# Patient Record
Sex: Male | Born: 2000 | Race: White | Hispanic: No | Marital: Single | Smoking: Never smoker
Health system: Southern US, Academic
[De-identification: ages and names within clinical notes are randomized; demographics above are authoritative.]

## PROBLEM LIST (undated history)

## (undated) DIAGNOSIS — R7303 Prediabetes: Secondary | ICD-10-CM

## (undated) DIAGNOSIS — I10 Essential (primary) hypertension: Secondary | ICD-10-CM

## (undated) HISTORY — DX: Prediabetes: R73.03

## (undated) HISTORY — PX: HX WISDOM TEETH EXTRACTION: SHX21

## (undated) HISTORY — DX: Essential (primary) hypertension: I10

## (undated) NOTE — Progress Notes (Signed)
 Formatting of this note might be different from the original.  Subjective   Patient ID: Jason Kelley is a 70 y.o. male presenting to the Urgent Care with a chief complaint of Penis Pain (Penis swelling with discomfort x today).    Red, swollen ring around penis, does not burn, itch, or have any lesions. States he is not concerned for STI he has had the same partner for 7-8 months.     History provided by:  Patient  Language interpreter used: No    Penis Pain  Onset quality:  Sudden  Duration:  1 day  Progression:  Unchanged  Chronicity:  New  Context:  Red, swollen ring around base of penis. there are no lesions, vesicles, or any other skin anomalies  Associated symptoms: no fever      Objective   BP 123/74   Pulse 98   Temp 36.3 C (97.4 F) (Temporal)   Resp 18   Ht 1.905 m (6' 3)   Wt (!) 143 kg (315 lb)   SpO2 98%   BMI 39.37 kg/m     Physical Exam  Vitals reviewed.   Constitutional:       Appearance: Normal appearance.   Cardiovascular:      Rate and Rhythm: Normal rate.   Pulmonary:      Effort: Pulmonary effort is normal.   Genitourinary:     Comments: Erythemic, shiny, red ring around base of penis, it is only a little swollen, he states it has improved since this morning. He has had no lesions. He states the swelling has improved. He is having no urinary symptoms.  Skin:     General: Skin is warm and dry.   Neurological:      General: No focal deficit present.      Mental Status: He is alert and oriented to person, place, and time.   Psychiatric:         Mood and Affect: Mood normal.         Behavior: Behavior normal.         Assessment & Plan    Assessment & Plan  Candidiasis            In-House Lab Results:   No results found for this or any previous visit.     In-House Imaging Reads:        Procedure Documentation:  Procedures     ED Course & MDM   MDM - Medical Decision Making: Patient declined HSV testing.   Electronically signed by Dufm Stephane Lunger, CRNP at 12/12/2023  2:20 PM EDT

---
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
## 2022-10-30 ENCOUNTER — Other Ambulatory Visit: Payer: Self-pay

## 2022-10-30 ENCOUNTER — Emergency Department
Admission: EM | Admit: 2022-10-30 | Discharge: 2022-10-30 | Disposition: A | Discharge status: Home / Self Care | Payer: Managed Care, Other (non HMO) | Attending: NURSE PRACTITIONER | Admitting: NURSE PRACTITIONER

## 2022-10-30 ENCOUNTER — Emergency Department (HOSPITAL_COMMUNITY): Payer: Managed Care, Other (non HMO)

## 2022-10-30 DIAGNOSIS — M5117 Intervertebral disc disorders with radiculopathy, lumbosacral region: Secondary | ICD-10-CM | POA: Insufficient documentation

## 2022-10-30 DIAGNOSIS — M5126 Other intervertebral disc displacement, lumbar region: Secondary | ICD-10-CM | POA: Insufficient documentation

## 2022-10-30 DIAGNOSIS — M5442 Lumbago with sciatica, left side: Secondary | ICD-10-CM

## 2022-10-30 DIAGNOSIS — M5136 Other intervertebral disc degeneration, lumbar region: Secondary | ICD-10-CM | POA: Insufficient documentation

## 2022-10-30 MED ORDER — METHOCARBAMOL 100 MG/ML INJECTION SOLUTION
1.0000 g | INTRAMUSCULAR | Status: DC
Start: 2022-10-30 — End: 2022-10-30

## 2022-10-30 MED ORDER — METHOCARBAMOL 100 MG/ML INJECTION SOLUTION
INTRAMUSCULAR | Status: AC
Start: 2022-10-30 — End: 2022-10-30
  Filled 2022-10-30: qty 10

## 2022-10-30 MED ORDER — MORPHINE 4 MG/ML INJECTION WRAPPER
INJECTION | INTRAMUSCULAR | Status: AC
Start: 2022-10-30 — End: 2022-10-30
  Filled 2022-10-30: qty 1

## 2022-10-30 MED ORDER — METHYLPREDNISOLONE SOD SUCC 125 MG SOLUTION FOR INJECTION WRAPPER
125.0000 mg | INTRAVENOUS | Status: AC
Start: 2022-10-30 — End: 2022-10-30
  Administered 2022-10-30: 125 mg via INTRAVENOUS

## 2022-10-30 MED ORDER — SODIUM CHLORIDE 0.9 % (FLUSH) INJECTION SYRINGE
3.0000 mL | INJECTION | Freq: Three times a day (TID) | INTRAMUSCULAR | Status: DC
Start: 2022-10-30 — End: 2022-10-30

## 2022-10-30 MED ORDER — METHOCARBAMOL 500 MG TABLET
500.0000 mg | ORAL_TABLET | Freq: Four times a day (QID) | ORAL | 0 refills | Status: AC
Start: 2022-10-30 — End: 2022-11-06

## 2022-10-30 MED ORDER — METHOCARBAMOL 100 MG/ML INJECTION SOLUTION
1.0000 g | INTRAMUSCULAR | Status: AC
Start: 2022-10-30 — End: 2022-10-30
  Administered 2022-10-30: 1000 mg via INTRAVENOUS

## 2022-10-30 MED ORDER — SODIUM CHLORIDE 0.9 % (FLUSH) INJECTION SYRINGE
3.0000 mL | INJECTION | INTRAMUSCULAR | Status: DC | PRN
Start: 2022-10-30 — End: 2022-10-30

## 2022-10-30 MED ORDER — MORPHINE 4 MG/ML INJECTION WRAPPER
4.0000 mg | INJECTION | INTRAMUSCULAR | Status: AC
Start: 2022-10-30 — End: 2022-10-30
  Administered 2022-10-30: 4 mg via INTRAVENOUS

## 2022-10-30 MED ORDER — PREDNISONE 20 MG TABLET
20.0000 mg | ORAL_TABLET | Freq: Two times a day (BID) | ORAL | 0 refills | Status: AC
Start: 2022-10-30 — End: 2022-11-04

## 2022-10-30 MED ORDER — NAPROXEN 500 MG TABLET
500.0000 mg | ORAL_TABLET | Freq: Two times a day (BID) | ORAL | 0 refills | Status: AC
Start: 2022-10-30 — End: ?

## 2022-10-30 MED ORDER — METHYLPREDNISOLONE SOD SUCC 125 MG SOLUTION FOR INJECTION WRAPPER
INTRAVENOUS | Status: AC
Start: 2022-10-30 — End: 2022-10-30
  Filled 2022-10-30: qty 2

## 2022-10-30 MED ORDER — TRAMADOL 50 MG TABLET
1.0000 | ORAL_TABLET | Freq: Three times a day (TID) | ORAL | 0 refills | Status: AC | PRN
Start: 2022-10-30 — End: 2022-11-02

## 2022-10-30 NOTE — ED Nurses Note (Signed)
Patient received to room 5 with complaints of pain, numbness, and tingling in back down left leg. Pt states this started 5 weeks ago and has had intermittent relief with muscle relaxer usage. Pt states he was seen last night in the ED for pain at another facility, was medicated with dilaudid and Norco. Patient placed on monitor, VSS, assessment and history completed. Call light in hand, encouraged to call for any needs. Awaiting providers orders at this time.

## 2022-10-30 NOTE — ED Triage Notes (Signed)
Patient c/o back pain and sciatica down the left leg with cramping in the left calf for 5 weeks. [place]. Numbness and tingling in the toes on left foot.
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
## 2022-10-30 NOTE — ED Nurses Note (Signed)
Patient discharged home with family. AVS reviewed with patient. A written copy of the AVS and discharge instructions were given to the patient. Questions sufficiently answered as needed. Patient encouraged to follow up with PCP as indicated. In the event of an emergency, patient instructed to call 911 or go to the nearest emergency room. Patient left department via ambulation. Pt significant other at bedside to drive pt home. Pt left with printed discharge instructions and printed prescriptions in hand.
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
## 2022-12-13 ENCOUNTER — Ambulatory Visit (INDEPENDENT_AMBULATORY_CARE_PROVIDER_SITE_OTHER): Payer: Self-pay | Admitting: Neurological Surgery

## 2022-12-13 NOTE — Telephone Encounter (Signed)
Patient had MRI done at [facility] in [place], [place], they are unable to push imaging to [place].  Confirmed with Ayinde that he has the imaging disc and will bring to his appointment on 12/24/22.    Shawnie Pons, Ambulatory Care Assistant'

## 2022-12-17 ENCOUNTER — Ambulatory Visit (INDEPENDENT_AMBULATORY_CARE_PROVIDER_SITE_OTHER): Payer: Self-pay | Admitting: Neurological Surgery

## 2022-12-24 ENCOUNTER — Ambulatory Visit: Payer: Managed Care, Other (non HMO) | Attending: Neurological Surgery | Admitting: Neurological Surgery

## 2022-12-24 ENCOUNTER — Encounter (INDEPENDENT_AMBULATORY_CARE_PROVIDER_SITE_OTHER): Payer: Self-pay | Admitting: Neurological Surgery

## 2022-12-24 ENCOUNTER — Other Ambulatory Visit: Payer: Self-pay

## 2022-12-24 VITALS — BP 147/78 | HR 81 | Temp 97.4°F | Ht 76.0 in | Wt 366.6 lb

## 2022-12-24 DIAGNOSIS — M545 Low back pain, unspecified: Secondary | ICD-10-CM | POA: Insufficient documentation

## 2022-12-24 DIAGNOSIS — M5416 Radiculopathy, lumbar region: Secondary | ICD-10-CM | POA: Insufficient documentation

## 2022-12-24 DIAGNOSIS — M5116 Intervertebral disc disorders with radiculopathy, lumbar region: Secondary | ICD-10-CM

## 2022-12-24 DIAGNOSIS — M5126 Other intervertebral disc displacement, lumbar region: Secondary | ICD-10-CM | POA: Insufficient documentation
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
## 2023-09-07 ENCOUNTER — Other Ambulatory Visit: Payer: Self-pay

## 2023-09-07 ENCOUNTER — Emergency Department
Admission: EM | Admit: 2023-09-07 | Discharge: 2023-09-08 | Disposition: A | Discharge status: Home / Self Care | Attending: Emergency Medicine | Admitting: Emergency Medicine

## 2023-09-07 DIAGNOSIS — R109 Unspecified abdominal pain: Secondary | ICD-10-CM

## 2023-09-07 DIAGNOSIS — R112 Nausea with vomiting, unspecified: Secondary | ICD-10-CM

## 2023-09-07 DIAGNOSIS — R1010 Upper abdominal pain, unspecified: Secondary | ICD-10-CM

## 2023-09-07 DIAGNOSIS — K760 Fatty (change of) liver, not elsewhere classified: Secondary | ICD-10-CM | POA: Insufficient documentation

## 2023-09-07 DIAGNOSIS — Z1152 Encounter for screening for COVID-19: Secondary | ICD-10-CM | POA: Insufficient documentation

## 2023-09-07 DIAGNOSIS — R1011 Right upper quadrant pain: Secondary | ICD-10-CM | POA: Insufficient documentation

## 2023-09-07 LAB — COVID-19, FLU A/B, RSV RAPID BY PCR
INFLUENZA VIRUS TYPE A: NOT DETECTED
INFLUENZA VIRUS TYPE B: NOT DETECTED
RESPIRATORY SYNCTIAL VIRUS (RSV): NOT DETECTED
SARS-CoV-2: NOT DETECTED

## 2023-09-07 NOTE — ED Triage Notes (Signed)
 Pt complains of abdominal pain lower right side, sharp,started suddenly on Monday. N/D denies nausea. Hurts with movement.

## 2023-09-08 ENCOUNTER — Emergency Department (HOSPITAL_COMMUNITY)

## 2023-09-08 ENCOUNTER — Encounter (HOSPITAL_COMMUNITY): Payer: Self-pay | Admitting: Emergency Medicine

## 2023-09-08 DIAGNOSIS — K6389 Other specified diseases of intestine: Secondary | ICD-10-CM

## 2023-09-08 DIAGNOSIS — K760 Fatty (change of) liver, not elsewhere classified: Secondary | ICD-10-CM

## 2023-09-08 LAB — CBC WITH DIFF
BASOPHIL #: 0 10*3/uL (ref 0.00–0.10)
BASOPHIL %: 1 % (ref 0–1)
EOSINOPHIL #: 0.2 10*3/uL (ref 0.00–0.50)
EOSINOPHIL %: 2 % (ref 1–8)
HCT: 43.2 % (ref 36.7–47.1)
HGB: 14.9 g/dL (ref 12.5–16.3)
LYMPHOCYTE #: 2.8 10*3/uL (ref 1.00–3.20)
LYMPHOCYTE %: 33 % (ref 15–43)
MCH: 28.7 pg (ref 23.8–33.4)
MCHC: 34.6 g/dL (ref 32.5–36.3)
MCV: 83 fL (ref 73.0–96.2)
MONOCYTE #: 1.1 10*3/uL (ref 0.30–1.10)
MONOCYTE %: 13 % (ref 6–14)
MPV: 8.6 fL (ref 7.4–11.4)
NEUTROPHIL #: 4.3 10*3/uL (ref 1.70–7.60)
NEUTROPHIL %: 51 % (ref 44–74)
PLATELETS: 300 10*3/uL (ref 140–440)
RBC: 5.2 10*6/uL (ref 4.06–5.63)
RDW: 14.2 % (ref 12.1–16.2)
WBC: 8.5 10*3/uL (ref 3.6–10.2)

## 2023-09-08 LAB — COMPREHENSIVE METABOLIC PANEL, NON-FASTING
ALBUMIN/GLOBULIN RATIO: 1.3 (ref 0.8–1.4)
ALBUMIN: 4.2 g/dL (ref 3.5–5.7)
ALKALINE PHOSPHATASE: 51 U/L (ref 34–104)
ALT (SGPT): 43 U/L (ref 7–52)
ANION GAP: 6 mmol/L (ref 4–13)
AST (SGOT): 24 U/L (ref 13–39)
BILIRUBIN TOTAL: 0.4 mg/dL (ref 0.3–1.0)
BUN/CREA RATIO: 25 — ABNORMAL HIGH (ref 6–22)
BUN: 20 mg/dL (ref 7–25)
CALCIUM, CORRECTED: 9.2 mg/dL (ref 8.9–10.8)
CALCIUM: 9.4 mg/dL (ref 8.6–10.3)
CHLORIDE: 106 mmol/L (ref 98–107)
CO2 TOTAL: 24 mmol/L (ref 21–31)
CREATININE: 0.8 mg/dL (ref 0.60–1.30)
ESTIMATED GFR: 128 mL/min/{1.73_m2} (ref >59)
GLOBULIN: 3.3 (ref 2.0–3.5)
GLUCOSE: 96 mg/dL (ref 74–109)
OSMOLALITY, CALCULATED: 275 mosm/kg (ref 270–290)
POTASSIUM: 4 mmol/L (ref 3.5–5.1)
PROTEIN TOTAL: 7.5 g/dL (ref 6.4–8.9)
SODIUM: 136 mmol/L (ref 136–145)

## 2023-09-08 LAB — URINALYSIS, MACROSCOPIC
BILIRUBIN: NEGATIVE mg/dL
BLOOD: NEGATIVE mg/dL
GLUCOSE: NEGATIVE mg/dL
KETONES: NEGATIVE mg/dL
LEUKOCYTES: NEGATIVE WBCs/uL
NITRITE: NEGATIVE
PH: 6.5 (ref 5.0–9.0)
PROTEIN: NEGATIVE mg/dL
SPECIFIC GRAVITY: 1.033 — ABNORMAL HIGH (ref 1.002–1.030)
UROBILINOGEN: NORMAL mg/dL

## 2023-09-08 LAB — LACTIC ACID LEVEL W/ REFLEX FOR LEVEL >2.0: LACTIC ACID: 0.8 mmol/L (ref 0.5–2.2)

## 2023-09-08 LAB — URINALYSIS, MICROSCOPIC
RBCS: <1 /HPF (ref <4)
SQUAMOUS EPITHELIAL: 1 /HPF (ref <28)
WBCS: 1 /HPF (ref <6)

## 2023-09-08 LAB — BLUE TOP TUBE

## 2023-09-08 LAB — GOLD TOP TUBE

## 2023-09-08 LAB — C-REACTIVE PROTEIN (CRP): C-REACTIVE PROTEIN (CRP): 2 mg/dL — ABNORMAL HIGH (ref 0.1–0.5)

## 2023-09-08 MED ORDER — METRONIDAZOLE 500 MG TABLET
500.0000 mg | ORAL_TABLET | Freq: Three times a day (TID) | ORAL | 0 refills | Status: AC
Start: 2023-09-08 — End: 2023-09-15

## 2023-09-08 MED ORDER — IOHEXOL 350 MG IODINE/ML INTRAVENOUS SOLUTION
50.0000 mL | INTRAVENOUS | Status: AC
Start: 2023-09-08 — End: 2023-09-08
  Administered 2023-09-08: 75 mL via INTRAVENOUS

## 2023-09-08 MED ORDER — CIPROFLOXACIN 500 MG TABLET
500.0000 mg | ORAL_TABLET | Freq: Two times a day (BID) | ORAL | 0 refills | Status: AC
Start: 2023-09-08 — End: 2023-09-15

## 2023-09-08 NOTE — ED Provider Notes (Signed)
 [facility] - Emergency Department  ED Primary Provider Note  History of Present Illness   Chief Complaint   Patient presents with    Abdominal Pain     Arrival: The patient arrived by Car  Jason Kelley is a 23 y.o. male who had concerns including Abdominal Pain.  The patient complains of pain in the right and upper abdomen.  The patient states his pain began in the right side of his abdomen and has now progressed across the top of his abdomen.  He states symptoms began 2 days ago.  States he has some nausea with the as vomiting.  Denies fever.     History Reviewed This Encounter: Medical History  Surgical History  Family History  Social History    Physical Exam   ED Triage Vitals [09/07/23 2306]   BP    Heart Rate 94   Respiratory Rate 20   Temperature 36.6 C (97.9 F)   SpO2    Weight (!) 159 kg (350 lb)   Height 1.93 m (6\' 4" )       Constitutional:  23 y.o. male who appears in no distress. Normal color, no cyanosis.   HENT:   Head: Normocephalic and atraumatic.   Mouth/Throat: Oropharynx is clear and moist.   Eyes: EOMI, PERRL   Neck: Trachea midline. Neck supple.  Cardiovascular: RRR, No murmurs, rubs or gallops. Intact distal pulses.  Pulmonary/Chest: BS equal bilaterally. No respiratory distress. No wheezes, rales or chest tenderness.   Abdominal: Bowel sounds present and normal. Abdomen soft, upper abdominal tenderness, no rebound and no guarding.  Back: No midline spinal tenderness, no paraspinal tenderness, no CVA tenderness.           Musculoskeletal: No edema, tenderness or deformity.  Skin: warm and dry. No rash, erythema, pallor or cyanosis  Psychiatric: normal mood and affect. Behavior is normal.   Neurological: Patient keenly alert and responsive, easily able to raise eyebrows, facial muscles/expressions symmetric, speaking in fluent sentences, moving all extremities equally and fully, normal gait  Patient Data     Labs Ordered/Reviewed   COMPREHENSIVE METABOLIC PANEL,  NON-FASTING - Abnormal; Notable for the following components:       Result Value    BUN/CREA RATIO 25 (*)     All other components within normal limits    Narrative:     Estimated Glomerular Filtration Rate (eGFR) is calculated using the CKD-EPI (2021) equation, intended for patients 31 years of age and older. If gender is not documented or "unknown", there will be no eGFR calculation.     C-REACTIVE PROTEIN (CRP) - Abnormal; Notable for the following components:    C-REACTIVE PROTEIN (CRP) 2.0 (*)     All other components within normal limits   URINALYSIS, MACROSCOPIC - Abnormal; Notable for the following components:    SPECIFIC GRAVITY 1.033 (*)     All other components within normal limits   COVID-19, FLU A/B, RSV RAPID BY PCR - Normal    Narrative:     Results are for the simultaneous qualitative identification of SARS-CoV-2 (formerly 2019-nCoV), Influenza A, Influenza B, and RSV RNA. These etiologic agents are generally detectable in nasopharyngeal and nasal swabs during the ACUTE PHASE of infection. Hence, this test is intended to be performed on respiratory specimens collected from individuals with signs and symptoms of upper respiratory tract infection who meet Centers for Disease Control and Prevention (CDC) clinical and/or epidemiological criteria for Coronavirus Disease 2019 (COVID-19) testing. CDC COVID-19 criteria  for testing on human specimens is available at [facility] webpage information for Healthcare Professionals: Coronavirus Disease 2019 (COVID-19) ([email or web address]).     False-negative results may occur if the virus has genomic mutations, insertions, deletions, or rearrangements or if performed very early in the course of illness. Otherwise, negative results indicate virus specific RNA targets are not detected, however negative results do not preclude SARS-CoV-2 infection/COVID-19, Influenza, or Respiratory syncytial virus infection. Results should not be used  as the sole basis for patient management decisions. Negative results must be combined with clinical observations, patient history, and epidemiological information. If upper respiratory tract infection is still suspected based on exposure history together with other clinical findings, re-testing should be considered.    Test methodology:   Cepheid Xpert Xpress SARS-CoV-2/Flu/RSV Assay real-time polymerase chain reaction (RT-PCR) test on the GeneXpert Dx and Xpert Xpress systems.   LACTIC ACID LEVEL W/ REFLEX FOR LEVEL >2.0 - Normal   CBC WITH DIFF - Normal   URINALYSIS, MICROSCOPIC - Normal   CBC/DIFF    Narrative:     The following orders were created for panel order CBC/DIFF.  Procedure                               Abnormality         Status                     ---------                               -----------         ------                     CBC WITH WNUU[[id number]]                Normal              Final result                 Please view results for these tests on the individual orders.   URINALYSIS, MACROSCOPIC AND MICROSCOPIC W/CULTURE REFLEX    Narrative:     The following orders were created for panel order URINALYSIS, MACROSCOPIC AND MICROSCOPIC W/CULTURE REFLEX [PRN ONLY].  Procedure                               Abnormality         Status                     ---------                               -----------         ------                     URINALYSIS, MACROSCOPIC[[id number]]      Abnormal            Final result               URINALYSIS, MICROSCOPIC[[id number]]      Normal              Final result  Please view results for these tests on the individual orders.   EXTRA TUBES    Narrative:     The following orders were created for panel order EXTRA TUBES.  Procedure                               Abnormality         Status                     ---------                               -----------         ------                     BLUE TOP [phone]                                     In process                 GOLD TOP TUBE[[id number]]                                    In process                   Please view results for these tests on the individual orders.   BLUE TOP TUBE   GOLD TOP TUBE     CT ABDOMEN PELVIS W IV CONTRAST   Final Result by Edi, Radresults In (05/08 0220)      1. Subtle inflammation abutting the cecal base, although this is in close proximity to the appendix the fact the appendix is not enlarged/dilated would argue against appendicitis. Diagnostic considerations would include a subtle omental infarct in the right lower quadrant or potentially a right-sided diverticulitis from a tiny diverticulum that is poorly visualized by CT. Close clinical follow-up is recommended   2. Hepatic steatosis   3. Additional findings/details as above         Radiologist location ID: [facility]           Medical Decision Making        Medical Decision Making  The patient presents to the ED due to upper abdominal pain.  Labs were obtained and are unremarkable.  CT abdomen pelvis shows some mild stranding at the cecum however there were no other definite or convincing signs of appendicitis.  The patient will be discharged home with a prescription for Cipro and Flagyl.      ED Course as of 09/08/23 0235   Thu Sep 08, 2023   0233 WBC: 8.5  Normal   0233 C-REACTIVE PROTEIN (CRP)(!): 2.0  Only slightly elevated   0233 LACTIC ACID: 0.8  Normal   0233 CREATININE: 0.80  Normal   0233 URINALYSIS, MACROSCOPIC AND MICROSCOPIC W/CULTURE REFLEX [PRN ONLY](!)  Normal            Medications Ordered/Administered in the ED   iohexol (OMNIPAQUE 350) infusion (75 mL Intravenous Given 09/08/23 0142)     Clinical Impression   Abdominal pain, unspecified abdominal location (Primary)       Disposition: Discharged

## 2023-09-08 NOTE — Discharge Instructions (Addendum)
 Cipro 500 mg twice daily for the next 7 days   Flagyl 500 mg every 8 hours for the next 7 days.      Return emergency department if worse and as needed.

## 2023-09-08 NOTE — ED Notes (Signed)
 [facility] - Emergency Department  Peer Recovery Coach Assessment    Initial Evaluation  Referred by:: Nurse  Location of Evaluation: Emergency Department  How many times in the last 12 months have you been to the ED?: 1  Have you ever served or are you currently serving in the Armed Forces?: No             Substance Use History  Patient current substance use status: FALSE POSITIVE DUE TO SCORING A 3 ON THE AUDIT C.  PEER SERVICES NOT REQUIRED.                                  Family, Social, Home & Safety History                                                 Employment            Engagement  Readiness ruler: 1    Brief Intervention  Discussed plan to reduce/quit substance use?: No  Discussed willingness to enter treatment?: No  Indicated patient's stage of change:: 1 - Precontemplation    Patient seen by Peer Recovery Coach and is a candidate for buprenorphine administration in the ED. Patient needs assessment for bup treatment.: No    Plan  Was the patient referred to treatment?: No    Was patient referred to physician for Buprenorphine Assessment in the ED?: No    Did patient receive Narcan in the ED?: No         Follow-up           Need for additional follow-up?: No       Bess Broody, Peer Recovery Coach 09/08/2023 10:29

## 2024-02-03 ENCOUNTER — Emergency Department (HOSPITAL_COMMUNITY)

## 2024-02-03 ENCOUNTER — Emergency Department
Admission: EM | Admit: 2024-02-03 | Discharge: 2024-02-03 | Discharge status: Against Medical Advice | Attending: Emergency Medicine | Admitting: Emergency Medicine

## 2024-02-03 ENCOUNTER — Other Ambulatory Visit: Payer: Self-pay

## 2024-02-03 DIAGNOSIS — M7989 Other specified soft tissue disorders: Secondary | ICD-10-CM | POA: Insufficient documentation

## 2024-02-03 DIAGNOSIS — W500XXA Accidental hit or strike by another person, initial encounter: Secondary | ICD-10-CM

## 2024-02-03 DIAGNOSIS — Z5320 Procedure and treatment not carried out because of patient's decision for unspecified reasons: Secondary | ICD-10-CM | POA: Insufficient documentation

## 2024-02-03 DIAGNOSIS — S6992XA Unspecified injury of left wrist, hand and finger(s), initial encounter: Secondary | ICD-10-CM

## 2024-02-03 DIAGNOSIS — Z5329 Procedure and treatment not carried out because of patient's decision for other reasons: Secondary | ICD-10-CM

## 2024-02-03 DIAGNOSIS — S60229A Contusion of unspecified hand, initial encounter: Secondary | ICD-10-CM

## 2024-02-03 DIAGNOSIS — W51XXXA Accidental striking against or bumped into by another person, initial encounter: Secondary | ICD-10-CM | POA: Insufficient documentation

## 2024-02-03 DIAGNOSIS — S60222A Contusion of left hand, initial encounter: Secondary | ICD-10-CM | POA: Insufficient documentation
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
## 2024-02-03 NOTE — ED Nurses Note (Signed)
 Called for the patient with no answer. The patient left the ed
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
